# Patient Record
Sex: Male | Born: 1990 | Race: White | Hispanic: No | Marital: Single | Smoking: Former smoker
Health system: Southern US, Community
[De-identification: ages and names within clinical notes are randomized; demographics above are authoritative.]

---
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
## 2017-08-04 ENCOUNTER — Emergency Department (HOSPITAL_COMMUNITY): Payer: Self-pay

## 2017-08-04 ENCOUNTER — Encounter (HOSPITAL_COMMUNITY): Payer: Self-pay | Admitting: *Deleted

## 2017-08-04 ENCOUNTER — Emergency Department (HOSPITAL_COMMUNITY)
Admission: EM | Admit: 2017-08-04 | Discharge: 2017-08-05 | Disposition: A | Discharge status: Home / Self Care | Payer: Self-pay | Attending: Emergency Medicine | Admitting: Emergency Medicine

## 2017-08-04 DIAGNOSIS — L0291 Cutaneous abscess, unspecified: Secondary | ICD-10-CM

## 2017-08-04 DIAGNOSIS — Z79899 Other long term (current) drug therapy: Secondary | ICD-10-CM | POA: Insufficient documentation

## 2017-08-04 DIAGNOSIS — Z87891 Personal history of nicotine dependence: Secondary | ICD-10-CM | POA: Insufficient documentation

## 2017-08-04 DIAGNOSIS — L03113 Cellulitis of right upper limb: Secondary | ICD-10-CM

## 2017-08-04 DIAGNOSIS — L02413 Cutaneous abscess of right upper limb: Secondary | ICD-10-CM | POA: Insufficient documentation

## 2017-08-04 MED ORDER — SULFAMETHOXAZOLE-TRIMETHOPRIM 800-160 MG PO TABS
1.0000 | ORAL_TABLET | Freq: Once | ORAL | Status: AC
  Administered 2017-08-04: 1 via ORAL
  Filled 2017-08-04: qty 1

## 2017-08-04 MED ORDER — SULFAMETHOXAZOLE-TRIMETHOPRIM 800-160 MG PO TABS
1.0000 | ORAL_TABLET | Freq: Two times a day (BID) | ORAL | 0 refills | Status: AC
Start: 2017-08-04 — End: 2017-08-18

## 2017-08-04 MED ORDER — CEPHALEXIN 500 MG PO CAPS: 500.0000 mg | ORAL_CAPSULE | Freq: Three times a day (TID) | ORAL | 0 refills | Status: AC

## 2017-08-04 MED ORDER — CLINDAMYCIN PHOSPHATE 900 MG/50ML IV SOLN: 900.0000 mg | Freq: Once | INTRAVENOUS | Status: DC

## 2017-08-04 MED ORDER — CEPHALEXIN 250 MG PO CAPS
500.0000 mg | ORAL_CAPSULE | Freq: Once | ORAL | Status: AC
  Administered 2017-08-04: 500 mg via ORAL
  Filled 2017-08-04: qty 2

## 2017-08-04 NOTE — ED Triage Notes (Signed)
Pt reports having abrasion to right elbow that is now swollen and red x 7 days. Feels like his forearm is swollen and states arm is warm to touch.

## 2017-08-04 NOTE — Discharge Instructions (Signed)
Please take full course of antibiotics. Return to ED for worsening redness or difficulty bending elbow. Follouwp with primary doctor at first availability.

## 2017-08-04 NOTE — ED Provider Notes (Signed)
[facility] EMERGENCY DEPARTMENT Provider Note   CSN: [id number] Arrival date & time: 08/04/17  1633     History   Chief Complaint Chief Complaint  Patient presents with  . Abscess    HPI Francisco Gates is a 26 y.o. male.  26 year old male previously healthy presents with right elbow erythema and localized edema.  Initially accidentally injured elbow on door frame 1 month ago.  He took a picture at that time which showed appears to be elbow hematoma. He has started a new job and once again accidentally hit his elbow.  The elbow wound been stable up until 4 days ago when it began to drain pustular material.  He is also noted erythema tracking proximally and distally to the wound.  Has range of motion of right elbow without pain.  Range of motion is slightly decreased due to localized edema. Site has felt warm. Denies fevers, N/V/D or other wounds.  The history is provided by the patient. No language interpreter was used.    History reviewed. No pertinent past medical history.  There are no active problems to display for this patient.   History reviewed. No pertinent surgical history.     Home Medications    Prior to Admission medications   Medication Sig Start Date End Date Taking? Authorizing Provider  ibuprofen (ADVIL,MOTRIN) 200 MG tablet Take 200 mg by mouth every 6 (six) hours as needed (pain).   Yes [provider]  cephALEXin (KEFLEX) 500 MG capsule Take 1 capsule (500 mg total) by mouth 3 (three) times daily. 08/04/17 08/18/17  Hebert Soho, MD  sulfamethoxazole-trimethoprim (BACTRIM DS,SEPTRA DS) 800-160 MG tablet Take 1 tablet by mouth 2 (two) times daily. 08/04/17 08/18/17  Hebert Soho, MD    Family History History reviewed. No pertinent family history.  Social History Social History  Substance Use Topics  . Smoking status: Former Games developer  . Smokeless tobacco: Not on file  . Alcohol use No     Allergies   Patient has no known  allergies.   Review of Systems Review of Systems  Constitutional: Negative for chills and fever.  HENT: Negative for ear pain and sore throat.   Eyes: Negative for pain and visual disturbance.  Respiratory: Negative for cough and shortness of breath.   Cardiovascular: Negative for chest pain and palpitations.  Gastrointestinal: Negative for abdominal pain and vomiting.  Genitourinary: Negative for dysuria and hematuria.  Musculoskeletal: Negative for arthralgias and back pain.  Skin: Positive for rash and wound. Negative for color change.  Neurological: Negative for seizures and syncope.  All other systems reviewed and are negative.    Physical Exam Updated Vital Signs BP (!) 146/84   Pulse 91   Temp 99.1 F (37.3 C) (Oral)   Resp 18   SpO2 97%   Physical Exam  Constitutional: He appears well-developed.  HENT:  Head: Normocephalic and atraumatic.  Eyes: Conjunctivae are normal.  Neck: Neck supple.  Cardiovascular: Normal rate and regular rhythm.   No murmur heard. Pulmonary/Chest: Effort normal and breath sounds normal. No respiratory distress.  Abdominal: Soft. There is no tenderness.  Musculoskeletal: He exhibits no edema.  R elbow without pain on ROM. Slightly reduced elbow ROM due to localized edema. Erythema proximally and distally to elbow wound. Purulence from small open wound on posterior L elbow  Neurological: He is alert. No cranial nerve deficit. Coordination normal.  5/5 motor strength and intact sensation in all extremities. Intact bilateral finger-to-nose coordination  Skin:  Skin is warm and dry.  Nursing note and vitals reviewed.    ED Treatments / Results  Labs (all labs ordered are listed, but only abnormal results are displayed) Labs Reviewed - No data to display  EKG  EKG Interpretation None       Radiology Dg Elbow Complete Right  Result Date: 08/04/2017 CLINICAL DATA:  Pt reports having abrasion to right elbow that is now swollen and  red x 7 days. Feels like his forearm is swollen and states arm is warm to touch. Wound on posterior elbow is leaking. EXAM: RIGHT ELBOW - COMPLETE 3+ VIEW COMPARISON:  None. FINDINGS: Subcutaneous edema is identified primarily medially. This corresponds to pre olecranon soft tissue swelling on the lateral view. No radiopaque foreign object. No acute fracture or dislocation. No joint effusion. No soft tissue gas IMPRESSION: Extensive soft tissue swelling, suggesting cellulitis. No acute osseous abnormality. Electronically Signed   By: Jeronimo GreavesKyle  Talbot M.D.   On: 08/04/2017 21:25    Procedures Procedures (including critical care time)  Medications Ordered in ED Medications  cephALEXin (KEFLEX) capsule 500 mg (500 mg Oral Given 08/04/17 2119)  sulfamethoxazole-trimethoprim (BACTRIM DS,SEPTRA DS) 800-160 MG per tablet 1 tablet (1 tablet Oral Given 08/04/17 2119)     Initial Impression / Assessment and Plan / ED Course  I have reviewed the triage vital signs and the nursing notes.  Pertinent labs & imaging results that were available during my care of the patient were reviewed by me and considered in my medical decision making (see chart for details).     26 yoM who p/w R elbow abscess and surrounding cellulitis. ROM of R elbow without pain. Mildly decreased ROM due to localized edema. Doubt septic joint. AF, VSS.  Purulence from open wound on posterior elbow. Wound enlarge with hemostats and additional purulence expressed. Hemostats used to examine for loculations. Pt instructed on wound care and warm compresses. Bactrim and keflex given for cellulitis. He is stable for continued outpatient followup.  Return precautions provided for worsening symptoms. Pt will f/u with PCP at first availability. Pt verbalized agreement with plan.  Pt care d/w Dr. Erma HeritageIsaacs  Final Clinical Impressions(s) / ED Diagnoses   Final diagnoses:  Cellulitis of right elbow  Abscess    New Prescriptions New  Prescriptions   CEPHALEXIN (KEFLEX) 500 MG CAPSULE    Take 1 capsule (500 mg total) by mouth 3 (three) times daily.   SULFAMETHOXAZOLE-TRIMETHOPRIM (BACTRIM DS,SEPTRA DS) 800-160 MG TABLET    Take 1 tablet by mouth 2 (two) times daily.     Hebert SohoMu, Gianni Mihalik, MD 08/05/17 1754    Shaune PollackIsaacs, Cameron, MD 08/06/17 [phone]

## 2017-08-05 NOTE — ED Provider Notes (Signed)
I saw and evaluated the patient, reviewed the resident's note and I agree with the findings and plan. I was present and provided direct supervision for all procedures. Please see associated encounter note. Briefly, the patient is a 26 yo M here with redness, erythema, drainage to R elbow. On exam, pt has cellulitis, possible folliculitis overlying elbow. ROM full however, no joint effusion, no signs of septic arthritis. U/S shows cobblestoning c/w cellulitis. He does have some expressible purulence from area of skin breakdown - question of resolving abscess, possible prior bursitis. This area was opened to ensure drainage. He is o/w afebrile, well appearing. Distal NVI. No diabetes or immune suppression. He would like to attempt PO tx as outpt and I feel this is reasonable. No signs of systemic infection, do not feel labs indicated or would be beneficial. Will d/c with keflex/bactrim 2/2 lack of insurance, close f/u.   EKG Interpretation None         Shaune PollackIsaacs, Deano Tomaszewski, MD 08/05/17 1152

## 2018-05-25 IMAGING — DX DG ELBOW COMPLETE 3+V*R*
4 series · 4 of 4 positions shown · non-contrast
Comparison: None.

CLINICAL DATA: Pt reports having abrasion to right elbow that is
now swollen and red x 7 days. Feels like his forearm is swollen and
states arm is warm to touch. Wound on posterior elbow is leaking.

EXAM:
RIGHT ELBOW - COMPLETE 3+ VIEW

[elbow ap]
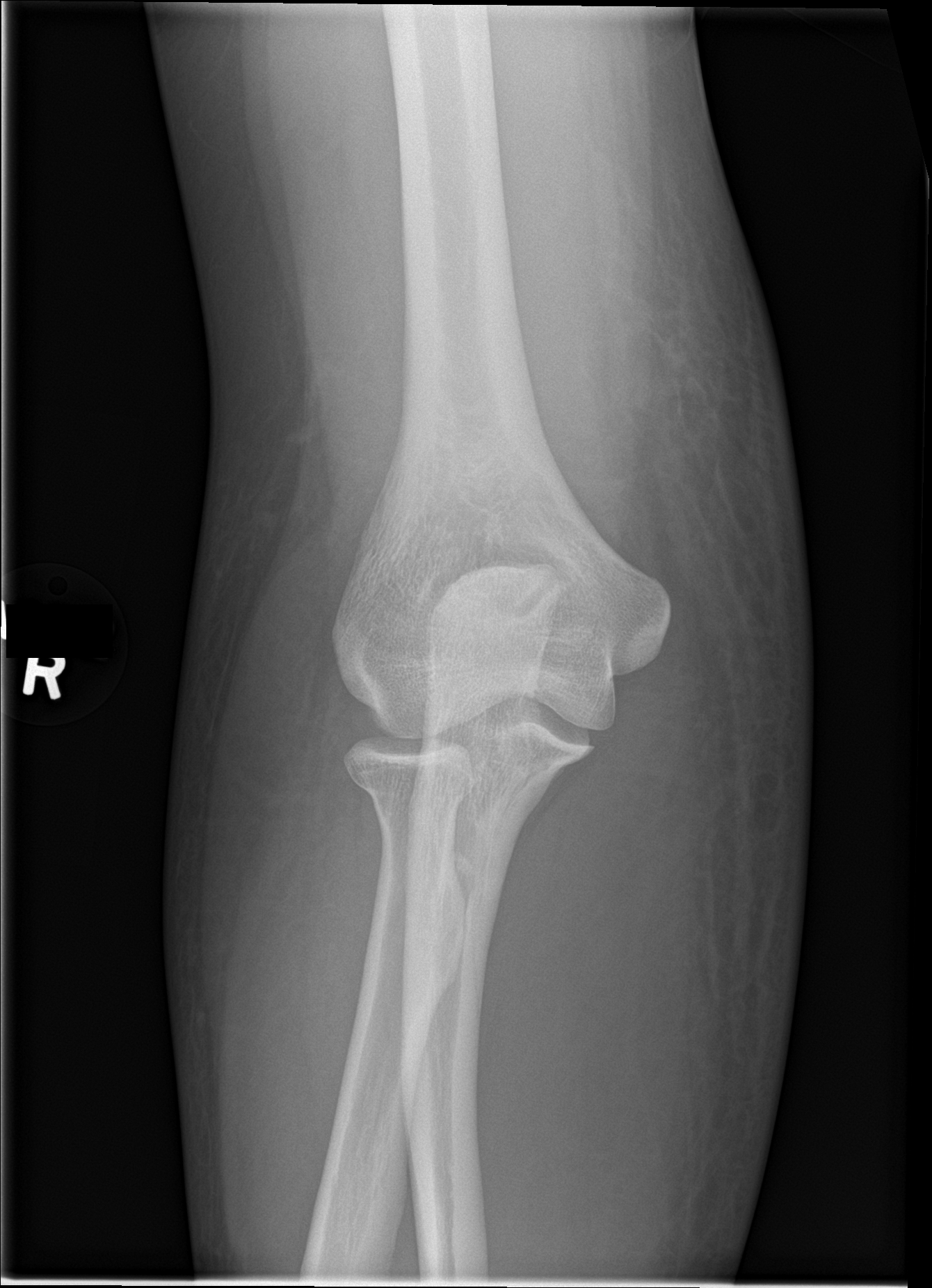

[elbow obl (1 of 2)]
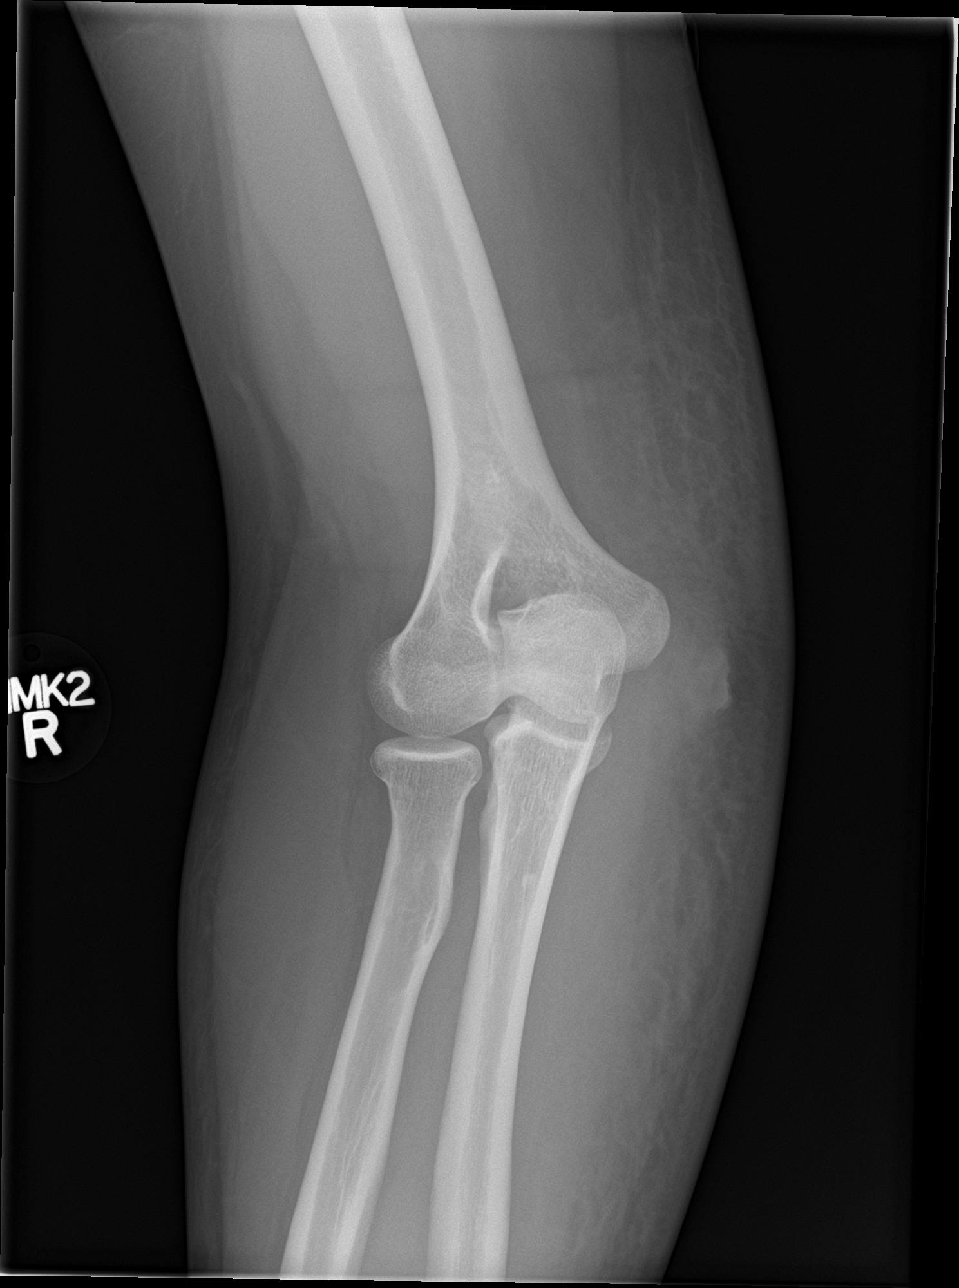

[elbow obl (2 of 2)]
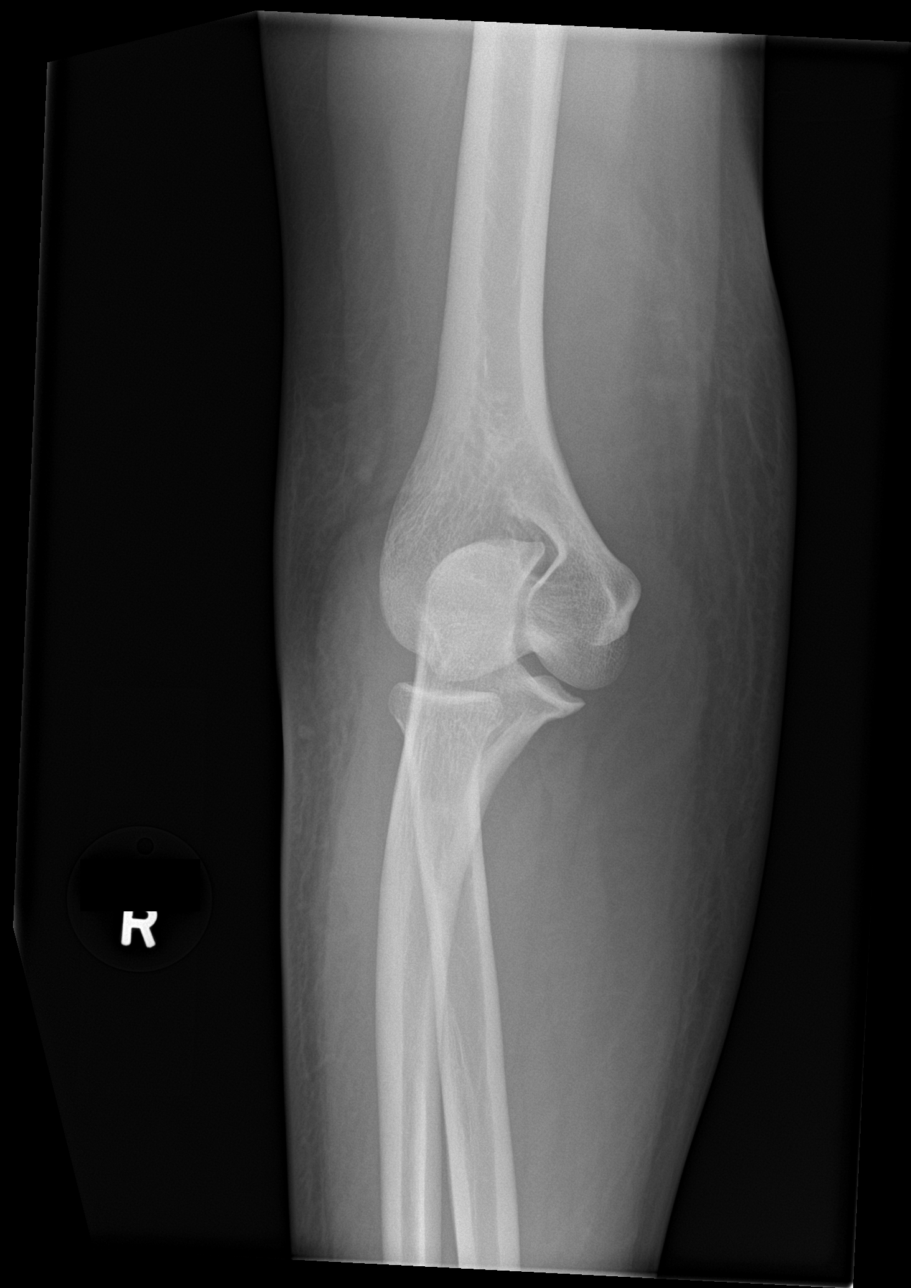

[elbow lat]
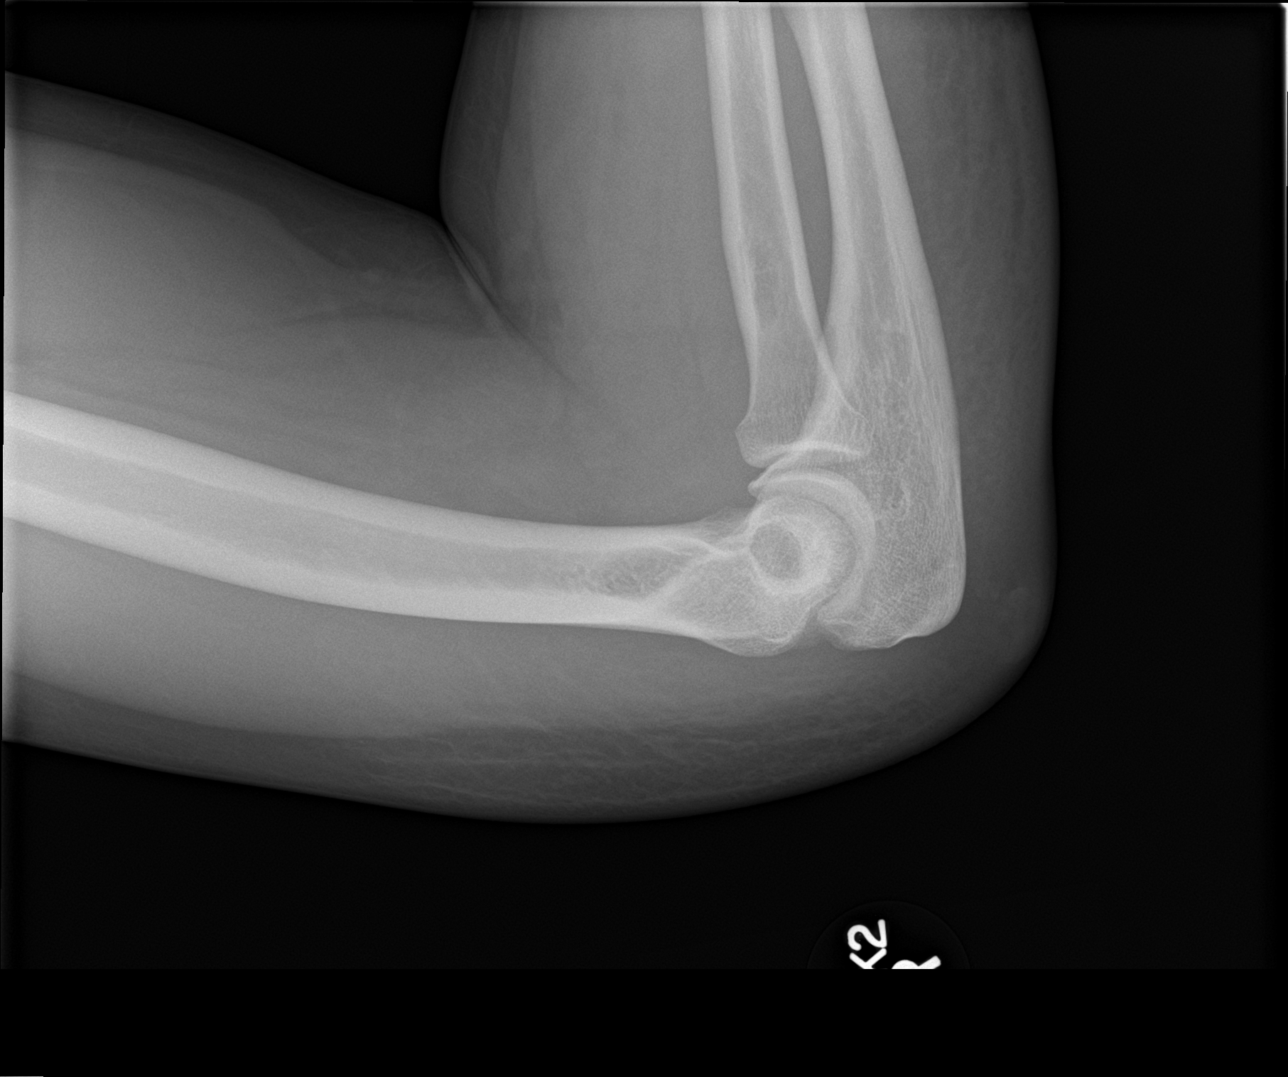

[4 of 4 positions shown; findings below may reference images not displayed]

FINDINGS: Subcutaneous edema is identified primarily medially. This
corresponds to pre olecranon soft tissue swelling on the lateral
view. No radiopaque foreign object. No acute fracture or
dislocation. No joint effusion. No soft tissue gas
IMPRESSION: Extensive soft tissue swelling, suggesting cellulitis. No acute
osseous abnormality.
